# Patient Record
Sex: Female | Born: 1995 | Race: White | Hispanic: No | Marital: Single | Smoking: Never smoker
Health system: Southern US, Community
[De-identification: ages and names within clinical notes are randomized; demographics above are authoritative.]

---
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
## 2019-06-10 ENCOUNTER — Other Ambulatory Visit: Payer: Self-pay

## 2019-06-10 ENCOUNTER — Ambulatory Visit (INDEPENDENT_AMBULATORY_CARE_PROVIDER_SITE_OTHER): Payer: 59

## 2019-06-10 ENCOUNTER — Ambulatory Visit (HOSPITAL_COMMUNITY)
Admission: EM | Admit: 2019-06-10 | Discharge: 2019-06-10 | Disposition: A | Discharge status: Home / Self Care | Payer: 59 | Attending: Physician Assistant | Admitting: Physician Assistant

## 2019-06-10 ENCOUNTER — Encounter (HOSPITAL_COMMUNITY): Payer: Self-pay | Admitting: Emergency Medicine

## 2019-06-10 DIAGNOSIS — R2241 Localized swelling, mass and lump, right lower limb: Secondary | ICD-10-CM | POA: Diagnosis not present

## 2019-06-10 DIAGNOSIS — S86911A Strain of unspecified muscle(s) and tendon(s) at lower leg level, right leg, initial encounter: Secondary | ICD-10-CM

## 2019-06-10 MED ORDER — HYDROCODONE-ACETAMINOPHEN 5-325 MG PO TABS
ORAL_TABLET | ORAL | Status: AC
  Filled 2019-06-10: qty 2

## 2019-06-10 MED ORDER — HYDROCODONE-ACETAMINOPHEN 5-325 MG PO TABS: 1.0000 | ORAL_TABLET | ORAL | 0 refills | Status: AC | PRN

## 2019-06-10 MED ORDER — HYDROCODONE-ACETAMINOPHEN 5-325 MG PO TABS
2.0000 | ORAL_TABLET | Freq: Once | ORAL | Status: AC
  Administered 2019-06-10: 2 via ORAL

## 2019-06-10 NOTE — ED Triage Notes (Signed)
PT fell while playing with dogs. PT reports her knee hyperextended and popped.

## 2019-06-10 NOTE — ED Provider Notes (Signed)
[facility]    CSN: [id number] Arrival date & time: 06/10/19  6546      History   Chief Complaint Chief Complaint  Patient presents with  . Knee Injury    HPI Christina Hamilton is a 23 y.o. female.   The history is provided by the patient. No language interpreter was used.  Knee Pain Location:  Knee Time since incident:  1 hour Injury: yes   Knee location:  R knee Pain details:    Quality:  Aching   Severity:  Moderate   Onset quality:  Gradual   Timing:  Constant   Progression:  Worsening Chronicity:  New Foreign body present:  No foreign bodies Prior injury to area:  No Relieved by:  Nothing Worsened by:  Nothing Ineffective treatments:  None tried Pt was hit by a running dog.  Right knee was hit and pushed backwards.  Pt complains of pain with walking  History reviewed. No pertinent past medical history.  There are no active problems to display for this patient.   History reviewed. No pertinent surgical history.  OB History   No obstetric history on file.      Home Medications    Prior to Admission medications   Medication Sig Start Date End Date Taking? Authorizing Provider  HYDROcodone-acetaminophen (NORCO/VICODIN) 5-325 MG tablet Take 1 tablet by mouth every 4 (four) hours as needed. 06/10/19   Fransico Meadow, PA-C    Family History No family history on file.  Social History Social History   Tobacco Use  . Smoking status: Never Smoker  Substance Use Topics  . Alcohol use: Yes  . Drug use: No     Allergies   Patient has no known allergies.   Review of Systems Review of Systems  Musculoskeletal: Positive for joint swelling and myalgias.  All other systems reviewed and are negative.    Physical Exam Triage Vital Signs ED Triage Vitals  Enc Vitals Group     BP 06/10/19 1945 (!) 92/54     Pulse Rate 06/10/19 1945 97     Resp 06/10/19 1945 16     Temp 06/10/19 1945 98.2 F (36.8 C)     Temp Source 06/10/19 1945 Oral   SpO2 06/10/19 1945 97 %     Weight --      Height --      Head Circumference --      Peak Flow --      Pain Score 06/10/19 1943 7     Pain Loc --      Pain Edu? --      Excl. in [place]? --    No data found.  Updated Vital Signs BP (!) 92/54   Pulse 97   Temp 98.2 F (36.8 C) (Oral)   Resp 16   LMP 05/20/2019   SpO2 97%   Visual Acuity Right Eye Distance:   Left Eye Distance:   Bilateral Distance:    Right Eye Near:   Left Eye Near:    Bilateral Near:     Physical Exam Vitals signs reviewed.  Musculoskeletal:        General: Swelling and tenderness present.     Comments: Swollen right knee,  Bruised right upper knee,  Pain with movement,  Negative drawer,  nv and ns intact  No medial or lateral instability    Skin:    General: Skin is warm.  Neurological:     General: No focal deficit present.  Mental Status: She is alert.  Psychiatric:        Mood and Affect: Mood normal.      UC Treatments / Results  Labs (all labs ordered are listed, but only abnormal results are displayed) Labs Reviewed - No data to display  EKG   Radiology Dg Knee Complete 4 Views Right  Result Date: 06/10/2019 CLINICAL DATA:  Knee injury difficulty with weight-bearing EXAM: RIGHT KNEE - COMPLETE 4+ VIEW COMPARISON:  None. FINDINGS: No evidence of fracture, dislocation, or joint effusion. No evidence of arthropathy or other focal bone abnormality. Soft tissues are unremarkable. IMPRESSION: Negative. Electronically Signed   By: Jasmine PangKim  Fujinaga M.D.   On: 06/10/2019 20:04    Procedures Procedures (including critical care time)  Medications Ordered in UC Medications  HYDROcodone-acetaminophen (NORCO/VICODIN) 5-325 MG per tablet 2 tablet (2 tablets Oral Given 06/10/19 2006)  HYDROcodone-acetaminophen (NORCO/VICODIN) 5-325 MG per tablet (has no administration in time range)    Initial Impression / Assessment and Plan / UC Course  I have reviewed the triage vital signs and the nursing  notes.  Pertinent labs & imaging results that were available during my care of the patient were reviewed by me and considered in my medical decision making (see chart for details).     MDM   xrays reviewed and discussed with pt,  Pt placed in a knee imbolizer and given crutches.  Pt advised to follow up with Dr. August Saucerean for recheck  Final Clinical Impressions(s) / UC Diagnoses   Final diagnoses:  Strain of right knee, initial encounter   Discharge Instructions   None    ED Prescriptions    Medication Sig Dispense Auth. Provider   HYDROcodone-acetaminophen (NORCO/VICODIN) 5-325 MG tablet Take 1 tablet by mouth every 4 (four) hours as needed. 16 tablet Elson AreasSofia, Jerry Clyne K, [place]-C     Controlled Substance Prescriptions [place] Controlled Substance Registry consulted? Not Applicable  An After Visit Summary was printed and given to the patient.    Elson AreasSofia, Burhanuddin Kohlmann K, [place] 06/10/19 2023

## 2019-06-30 ENCOUNTER — Other Ambulatory Visit: Payer: Self-pay | Admitting: *Deleted

## 2019-06-30 DIAGNOSIS — Z20822 Contact with and (suspected) exposure to covid-19: Secondary | ICD-10-CM

## 2019-07-01 LAB — NOVEL CORONAVIRUS, NAA: SARS-CoV-2, NAA: NOT DETECTED

## 2021-04-30 IMAGING — DX RIGHT KNEE - COMPLETE 4+ VIEW
5 series · 5 of 5 positions shown · non-contrast
Comparison: None.

CLINICAL DATA: Knee injury difficulty with weight-bearing

EXAM:
RIGHT KNEE - COMPLETE 4+ VIEW

[knee ap]
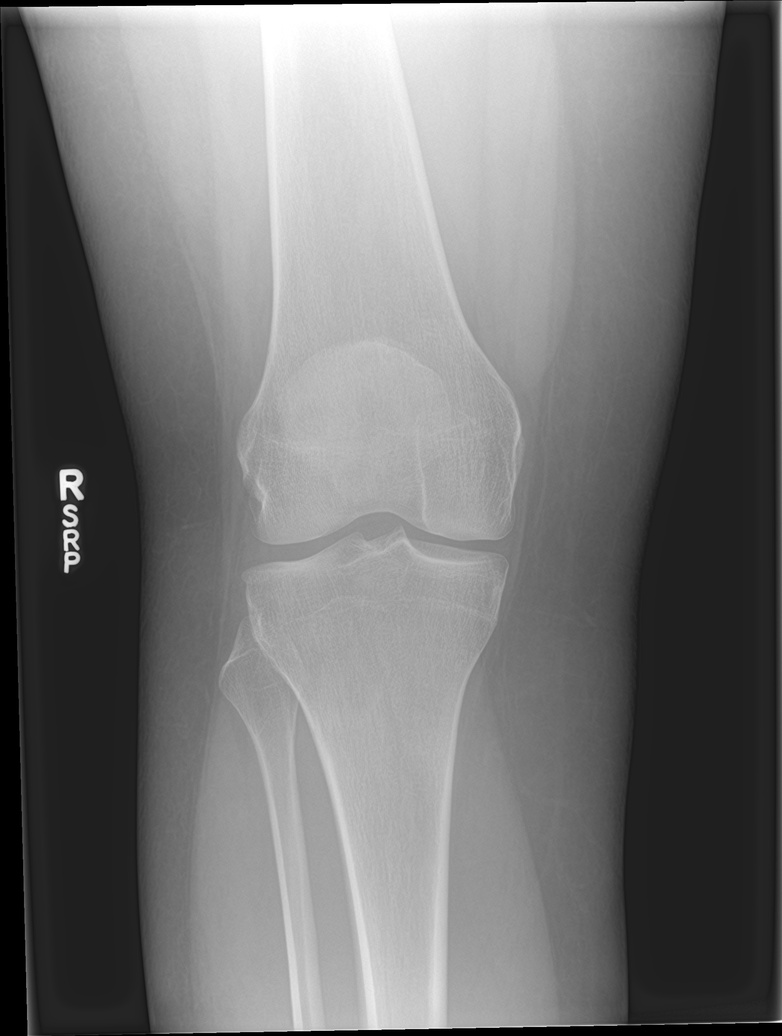

[knee obl (1 of 2)]
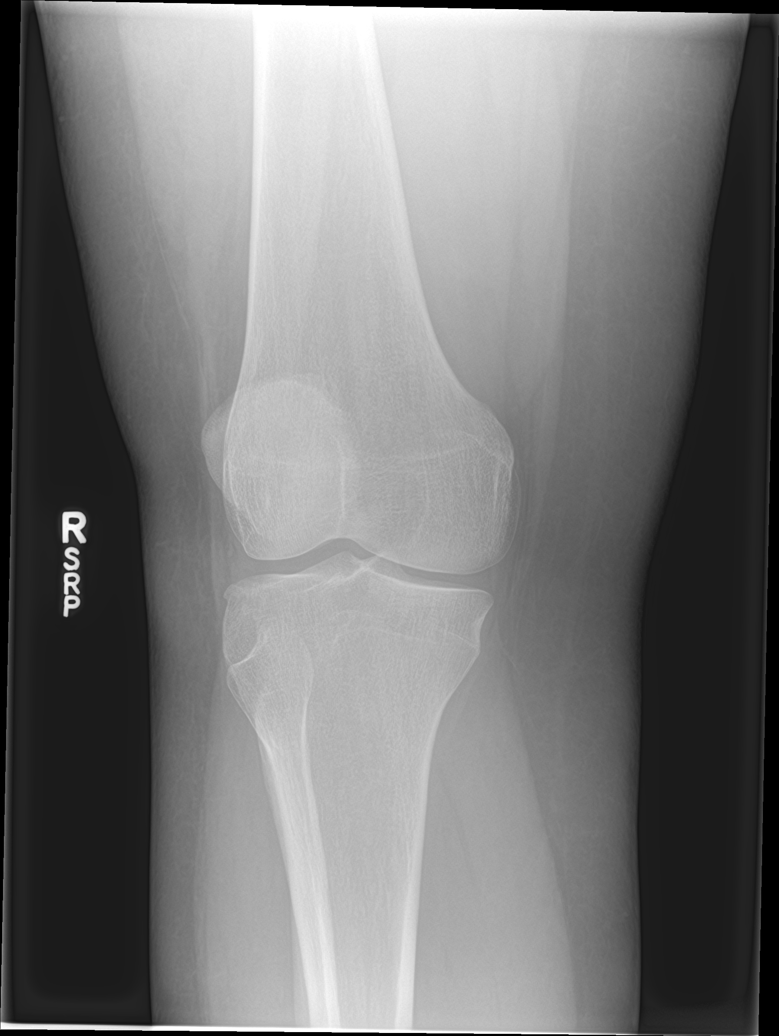

[knee obl (2 of 2)]
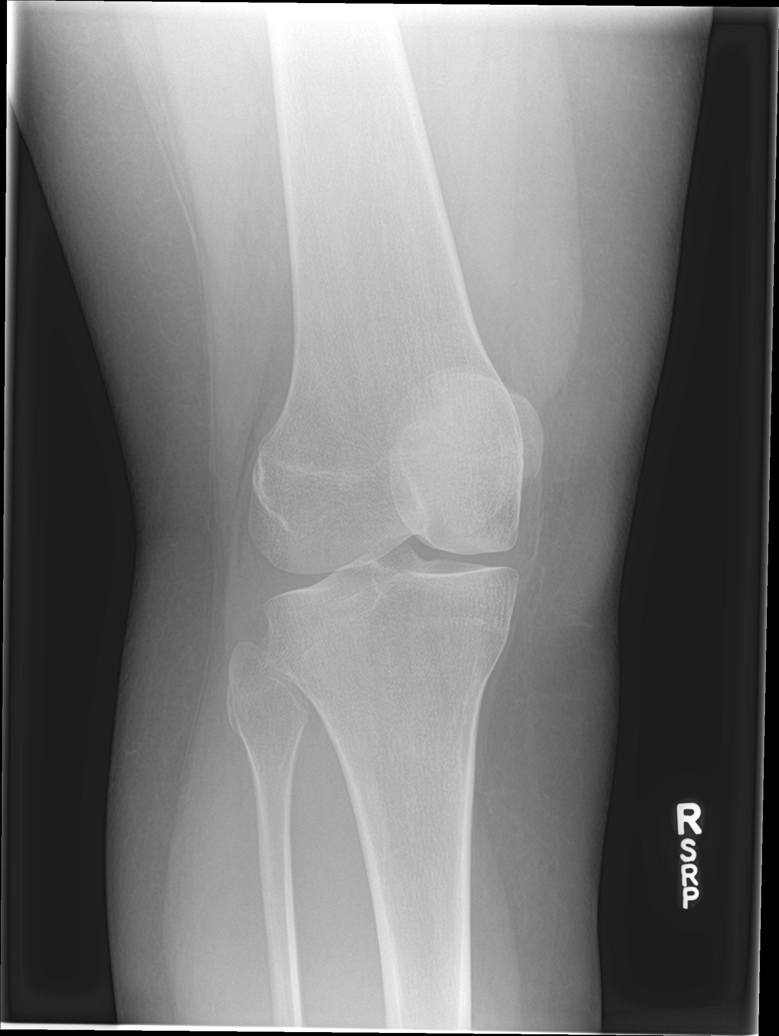

[knee lat]
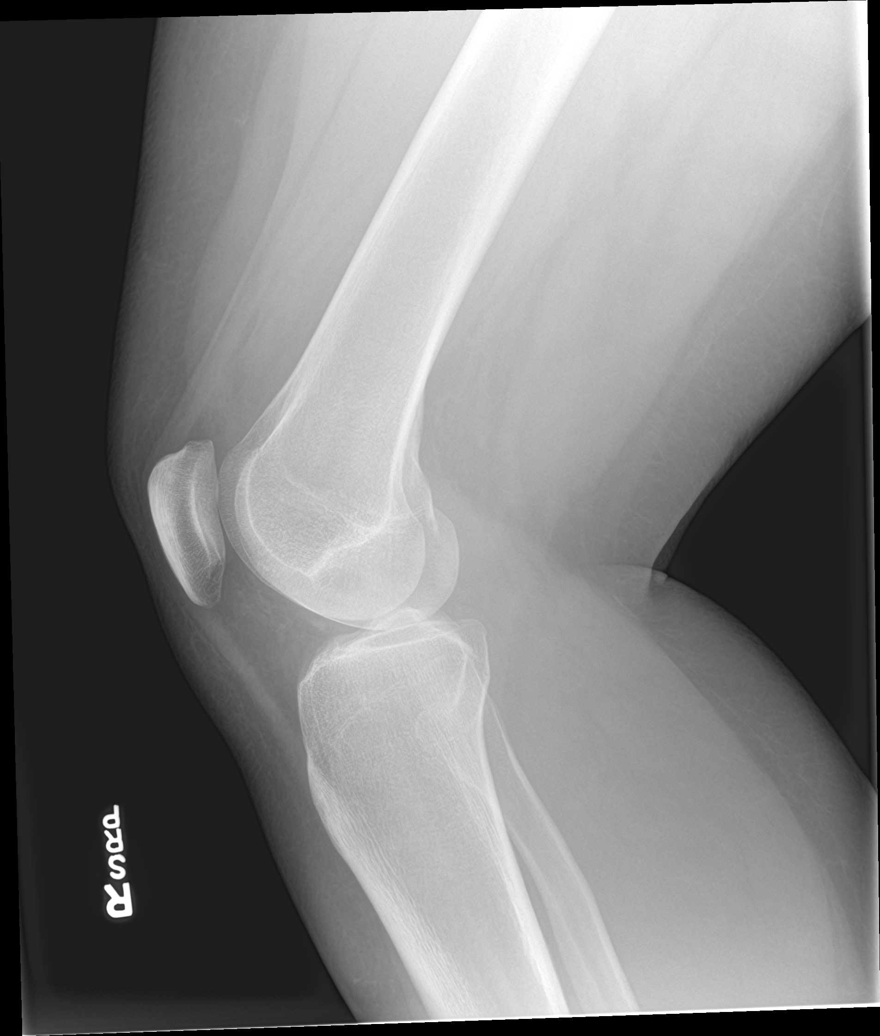

[knee sunrise]
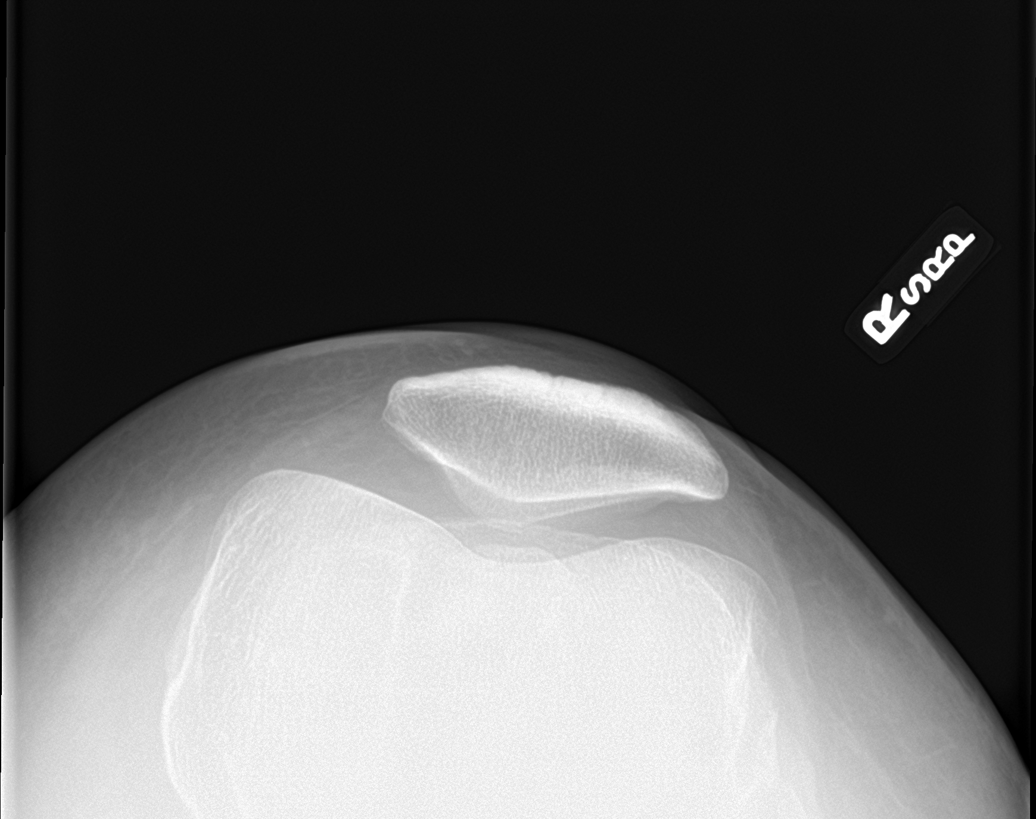

[5 of 5 positions shown; findings below may reference images not displayed]

FINDINGS: No evidence of fracture, dislocation, or joint effusion. No evidence
of arthropathy or other focal bone abnormality. Soft tissues are
unremarkable.
IMPRESSION: Negative.
